# Patient Record
Sex: Male | Born: 1970 | Race: White | Hispanic: No | Marital: Married | Smoking: Never smoker
Health system: Southern US, Community
[De-identification: ages and names within clinical notes are randomized; demographics above are authoritative.]

## PROBLEM LIST (undated history)

## (undated) DIAGNOSIS — K529 Noninfective gastroenteritis and colitis, unspecified: Secondary | ICD-10-CM

## (undated) HISTORY — PX: KNEE SURGERY: SHX244

---

## 2019-07-16 ENCOUNTER — Encounter (HOSPITAL_COMMUNITY): Payer: Self-pay | Admitting: *Deleted

## 2019-07-16 ENCOUNTER — Emergency Department (HOSPITAL_COMMUNITY)
Admission: EM | Admit: 2019-07-16 | Discharge: 2019-07-16 | Disposition: A | Discharge status: Home / Self Care | Payer: No Typology Code available for payment source | Attending: Emergency Medicine | Admitting: Emergency Medicine

## 2019-07-16 ENCOUNTER — Emergency Department (HOSPITAL_COMMUNITY): Payer: No Typology Code available for payment source

## 2019-07-16 ENCOUNTER — Other Ambulatory Visit: Payer: Self-pay

## 2019-07-16 DIAGNOSIS — R531 Weakness: Secondary | ICD-10-CM | POA: Diagnosis present

## 2019-07-16 DIAGNOSIS — R202 Paresthesia of skin: Secondary | ICD-10-CM

## 2019-07-16 HISTORY — DX: Noninfective gastroenteritis and colitis, unspecified: K52.9

## 2019-07-16 LAB — COMPREHENSIVE METABOLIC PANEL
ALT: 20 U/L (ref 0–44)
AST: 21 U/L (ref 15–41)
Albumin: 4.7 g/dL (ref 3.5–5.0)
Alkaline Phosphatase: 44 U/L (ref 38–126)
Anion gap: 9 (ref 5–15)
BUN: 13 mg/dL (ref 6–20)
CO2: 26 mmol/L (ref 22–32)
Calcium: 9.2 mg/dL (ref 8.9–10.3)
Chloride: 104 mmol/L (ref 98–111)
Creatinine, Ser: 1.06 mg/dL (ref 0.61–1.24)
GFR calc Af Amer: >60 mL/min (ref >60)
GFR calc non Af Amer: >60 mL/min (ref >60)
Glucose, Bld: 141 mg/dL — ABNORMAL HIGH (ref 70–99)
Potassium: 3.7 mmol/L (ref 3.5–5.1)
Sodium: 139 mmol/L (ref 135–145)
Total Bilirubin: 2.4 mg/dL — ABNORMAL HIGH (ref 0.3–1.2)
Total Protein: 7.6 g/dL (ref 6.5–8.1)

## 2019-07-16 LAB — CBC WITH DIFFERENTIAL/PLATELET
Abs Immature Granulocytes: 0.04 10*3/uL (ref 0.00–0.07)
Basophils Absolute: 0.1 10*3/uL (ref 0.0–0.1)
Basophils Relative: 1 %
Eosinophils Absolute: 0.3 10*3/uL (ref 0.0–0.5)
Eosinophils Relative: 4 %
HCT: 45.2 % (ref 39.0–52.0)
Hemoglobin: 15.4 g/dL (ref 13.0–17.0)
Immature Granulocytes: 1 %
Lymphocytes Relative: 29 %
Lymphs Abs: 2.4 10*3/uL (ref 0.7–4.0)
MCH: 32.3 pg (ref 26.0–34.0)
MCHC: 34.1 g/dL (ref 30.0–36.0)
MCV: 94.8 fL (ref 80.0–100.0)
Monocytes Absolute: 0.9 10*3/uL (ref 0.1–1.0)
Monocytes Relative: 11 %
Neutro Abs: 4.4 10*3/uL (ref 1.7–7.7)
Neutrophils Relative %: 54 %
Platelets: 246 10*3/uL (ref 150–400)
RBC: 4.77 MIL/uL (ref 4.22–5.81)
RDW: 11.9 % (ref 11.5–15.5)
WBC: 8.3 10*3/uL (ref 4.0–10.5)
nRBC: 0 % (ref 0.0–0.2)

## 2019-07-16 MED ORDER — LORAZEPAM 2 MG/ML IJ SOLN
0.5000 mg | Freq: Once | INTRAMUSCULAR | Status: AC
  Administered 2019-07-16: 10:00:00 0.5 mg via INTRAVENOUS
  Filled 2019-07-16: qty 1

## 2019-07-16 NOTE — ED Provider Notes (Signed)
[facility] DEPT Provider Note   CSN: [id number] Arrival date & time: 07/16/19  3762     History Chief Complaint  Patient presents with  . Numbness    face  . Nausea    Billy Butler is a 49 y.o. male.  Patient was driving and then started having left-sided facial.  He has been treated for a sinus infection recently  The history is provided by the patient. No language interpreter was used.  Weakness Severity:  Mild Onset quality:  Sudden Timing:  Intermittent Progression:  Waxing and waning Chronicity:  New Context: not alcohol use   Relieved by:  Nothing Worsened by:  Nothing Associated symptoms: no abdominal pain, no chest pain, no cough, no diarrhea, no frequency, no headaches and no seizures        Past Medical History:  Diagnosis Date  . Colitis     There are no problems to display for this patient.   Past Surgical History:  Procedure Laterality Date  . KNEE SURGERY         No family history on file.  Social History   Tobacco Use  . Smoking status: Never Smoker  . Smokeless tobacco: Never Used  Substance Use Topics  . Alcohol use: Never  . Drug use: Never    Home Medications Prior to Admission medications   Medication Sig Start Date End Date Taking? Authorizing Provider  acetaminophen (TYLENOL) 325 MG tablet Take 650 mg by mouth every 6 (six) hours as needed for headache.   Yes [provider]  Multiple Vitamins-Minerals (MULTIVITAMIN WITH MINERALS) tablet Take 1 tablet by mouth daily.   Yes [provider]    Allergies    Patient has no known allergies.  Review of Systems   Review of Systems  Constitutional: Negative for appetite change and fatigue.  HENT: Negative for congestion, ear discharge and sinus pressure.        Facial numbness  Eyes: Negative for discharge.  Respiratory: Negative for cough.   Cardiovascular: Negative for chest pain.  Gastrointestinal: Negative for abdominal  pain and diarrhea.  Genitourinary: Negative for frequency and hematuria.  Musculoskeletal: Negative for back pain.  Skin: Negative for rash.  Neurological: Positive for weakness. Negative for seizures and headaches.  Psychiatric/Behavioral: Negative for hallucinations.    Physical Exam Updated Vital Signs BP 130/82   Pulse 89   Temp 97.8 F (36.6 C) (Oral)   Resp 18   Ht 6\' 2"  (1.88 m)   Wt 90.7 kg   SpO2 96%   BMI 25.68 kg/m   Physical Exam Vitals and nursing note reviewed.  Constitutional:      Appearance: He is well-developed.  HENT:     Head: Normocephalic.     Comments: Mild facial numbness    Nose: Nose normal.  Eyes:     General: No scleral icterus.    Conjunctiva/sclera: Conjunctivae normal.  Neck:     Thyroid: No thyromegaly.  Cardiovascular:     Rate and Rhythm: Normal rate and regular rhythm.     Heart sounds: No murmur. No friction rub. No gallop.   Pulmonary:     Breath sounds: No stridor. No wheezing or rales.  Chest:     Chest wall: No tenderness.  Abdominal:     General: There is no distension.     Tenderness: There is no abdominal tenderness. There is no rebound.  Musculoskeletal:        General: Normal range of motion.  Cervical back: Neck supple.  Lymphadenopathy:     Cervical: No cervical adenopathy.  Skin:    Findings: No erythema or rash.  Neurological:     Mental Status: He is alert and oriented to person, place, and time.     Motor: No abnormal muscle tone.     Coordination: Coordination normal.  Psychiatric:        Behavior: Behavior normal.     ED Results / Procedures / Treatments   Labs (all labs ordered are listed, but only abnormal results are displayed) Labs Reviewed  COMPREHENSIVE METABOLIC PANEL - Abnormal; Notable for the following components:      Result Value   Glucose, Bld 141 (*)    Total Bilirubin 2.4 (*)    All other components within normal limits  CBC WITH DIFFERENTIAL/PLATELET    EKG EKG  Interpretation  Date/Time:  Friday July 16 2019 09:34:06 EST Ventricular Rate:  121 PR Interval:    QRS Duration: 94 QT Interval:  310 QTC Calculation: 440 R Axis:   61 Text Interpretation: Sinus tachycardia Probable left atrial enlargement Probable inferior infarct, 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 COMPARISON:  None. FINDINGS: Brain: There is no acute infarction or intracranial hemorrhage. There is no intracranial mass, mass effect, or edema. There is no hydrocephalus or extra-axial fluid collection. Ventricles and sulci are normal in size and configuration. A few small foci of T2 hyperintensity in the supratentorial white matter are nonspecific but may reflect minor chronic microvascular ischemic changes. Vascular: Major vessel flow voids at the skull base are preserved. Skull and upper cervical spine: Normal marrow signal is preserved. Sinuses/Orbits: Mild mucosal thickening.  Left lens replacement. Other: Sella is unremarkable.  Mastoid air cells are clear. IMPRESSION: No acute infarction, hemorrhage, or mass. Minor chronic microvascular ischemic changes. Electronically Signed   By: Guadlupe Spanish M.D.   On: 07/16/2019 11:49    Procedures Procedures (including critical care time)  Medications Ordered in ED Medications  LORazepam (ATIVAN) injection 0.5 mg (0.5 mg Intravenous Given 07/16/19 1015)    ED Course  I have reviewed the triage vital signs and the nursing notes.  Pertinent labs & imaging results that were available during my care of the patient were reviewed by me and considered in my medical decision making (see chart for details).    MDM  Rules/Calculators/A&P                      Mri neg,   Pt with facial numbness.  Will follow up with pcp Final Clinical Impression(s) / ED Diagnoses Final diagnoses:  None    Rx / DC Orders ED Discharge Orders    None       Bethann Berkshire, MD 07/16/19 1224

## 2019-07-16 NOTE — ED Triage Notes (Signed)
Pt states 30 min ago sudden onset of left facial numbness also with left arm, no noted deficits in neuro assessment except alerted facial sensation. Nausea accompanied symptoms

## 2019-07-16 NOTE — Discharge Instructions (Addendum)
Follow-up with your family doctor in the next week or 2 for recheck of your sinuses and your numbness to your face

## 2019-07-16 NOTE — ED Notes (Signed)
Patient transported to MRI 

## 2019-07-16 NOTE — ED Notes (Signed)
Patient remains in MRI 

## 2021-04-09 IMAGING — MR MR HEAD W/O CM
10 series · 48 of 48 positions shown · non-contrast
Comparison: None.

CLINICAL DATA: Left facial and arm numbness

EXAM:
MRI HEAD WITHOUT CONTRAST
TECHNIQUE: Multiplanar, multiecho pulse sequences of the brain and surrounding
structures were obtained without intravenous contrast.

[Series 5: T1 · sagittal · 5.0mm · 0.75mm/px · 2 of 24 slices shown (1 of 2)]
[im 1/24]
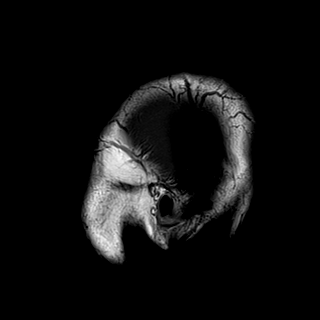
[im 24/24]
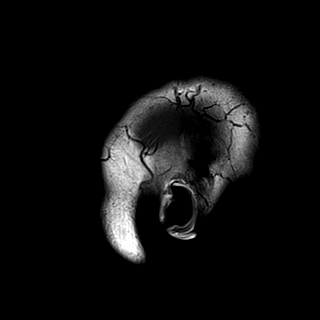

[Series 6: T2 · axial · 5.0mm · 0.62mm/px · z∈[-53,+105]mm · 2 of 26 slices shown (1 of 2)]
[im 1/26]
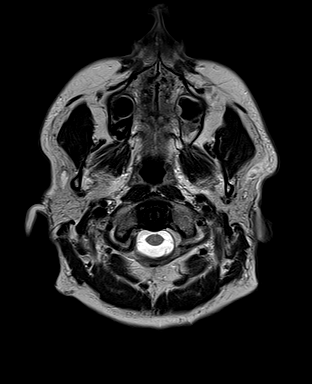
[im 26/26]
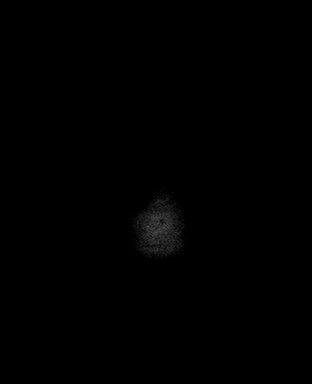

[Series 7: DWI · axial · 3.0mm · 1.36mm/px · z∈[-49,+100]mm · 8 of 104 slices shown (1 of 3)]
[im 1/104]
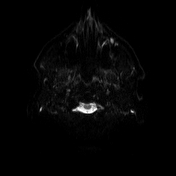
[im 15/104]
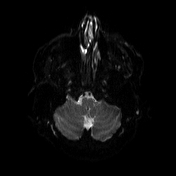
[im 30/104]
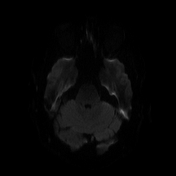
[im 45/104]
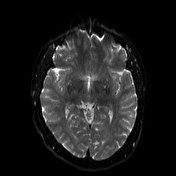
[im 59/104]
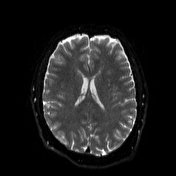
[im 74/104]
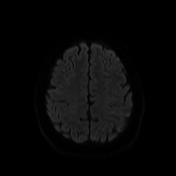
[im 89/104]
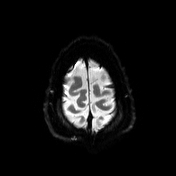
[im 104/104]
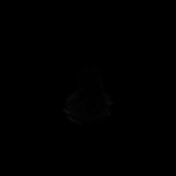

[Series 9: mip_images(sw) · axial · 24.0mm · 0.75mm/px · z∈[-46,+94]mm · 4 of 49 slices shown]
[im 1/49]
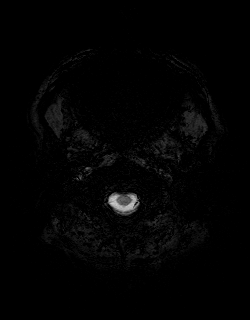
[im 17/49]
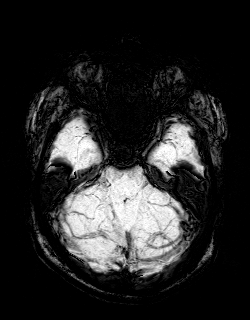
[im 33/49]
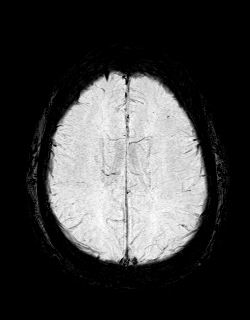
[im 49/49]
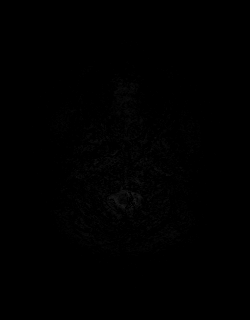

[Series 10: swi_images · axial · 3.0mm · 0.75mm/px · z∈[-56,+105]mm · 4 of 56 slices shown]
[im 1/56]
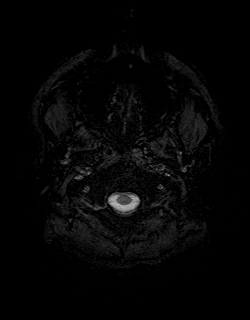
[im 19/56]
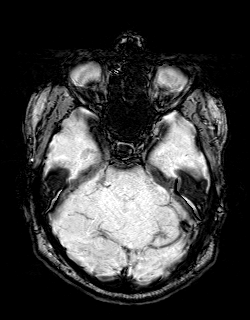
[im 37/56]
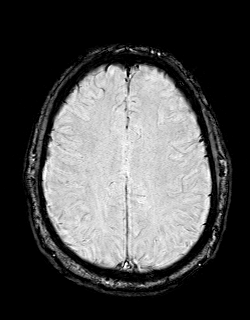
[im 56/56]
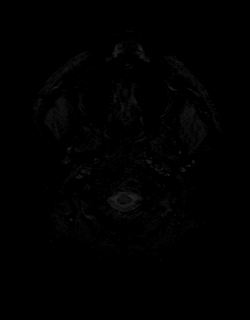

[Series 11: FLAIR · axial · 3.0mm · 0.75mm/px · z∈[-53,+102]mm · 4 of 54 slices shown]
[im 1/54]
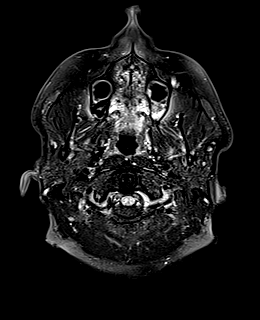
[im 18/54]
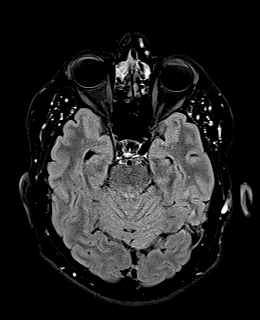
[im 36/54]
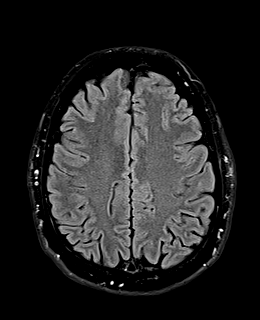
[im 54/54]
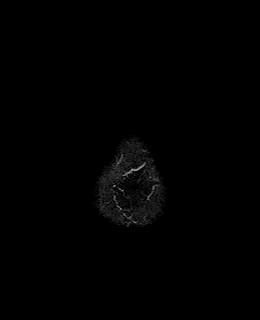

[Series 12: T1 · axial · 1.0mm · 0.94mm/px · z∈[-51,+103]mm · 12 of 160 slices shown (2 of 2)]
[im 1/160]
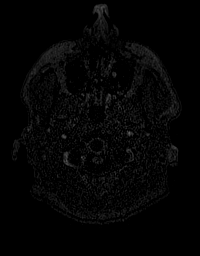
[im 15/160]
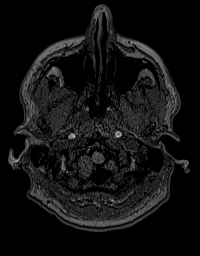
[im 29/160]
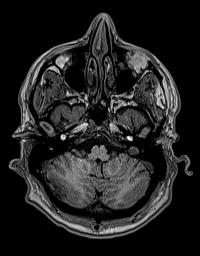
[im 44/160]
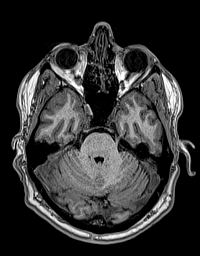
[im 58/160]
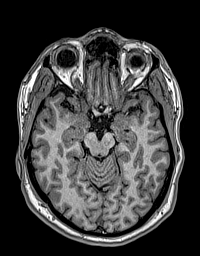
[im 73/160]
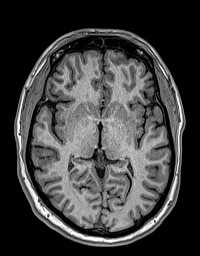
[im 87/160]
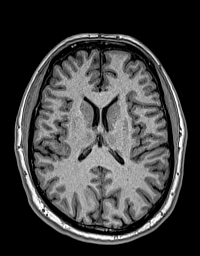
[im 102/160]
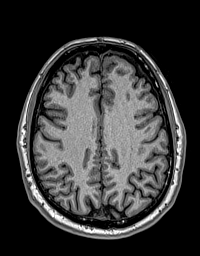
[im 116/160]
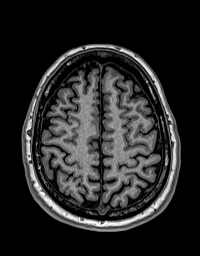
[im 131/160]
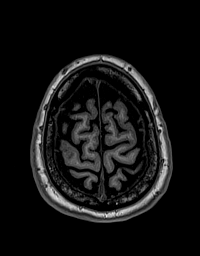
[im 145/160]
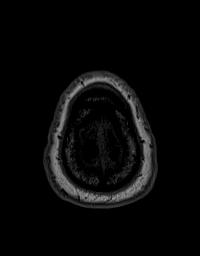
[im 160/160]
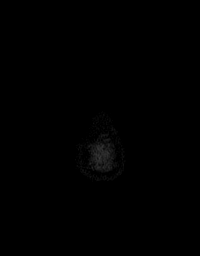

[Series 13: DWI · coronal · 5.0mm · 1.31mm/px · 6 of 72 slices shown (2 of 3)]
[im 1/72]
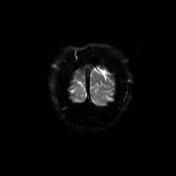
[im 15/72]
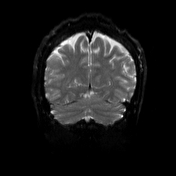
[im 29/72]
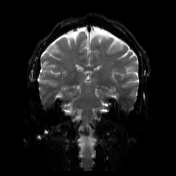
[im 43/72]
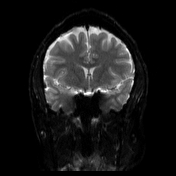
[im 57/72]
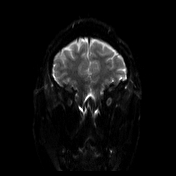
[im 72/72]
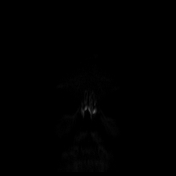

[Series 14: DWI · coronal · 5.0mm · 1.31mm/px · 3 of 36 slices shown (3 of 3)]
[im 1/36]
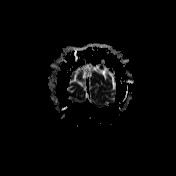
[im 18/36]
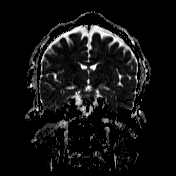
[im 36/36]
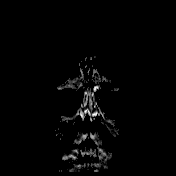

[Series 15: T2 · coronal · 5.0mm · 0.57mm/px · 3 of 36 slices shown (2 of 2)]
[im 1/36]
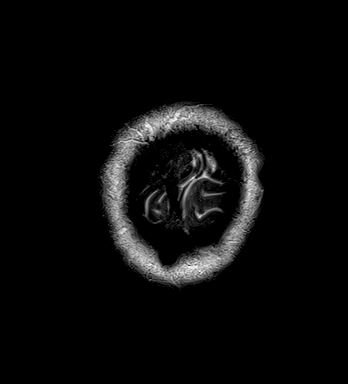
[im 18/36]
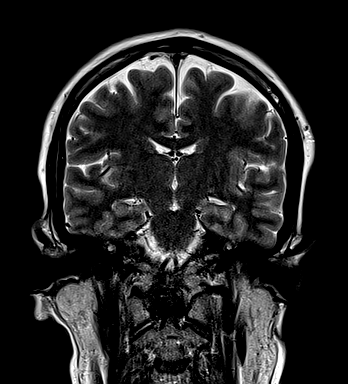
[im 36/36]
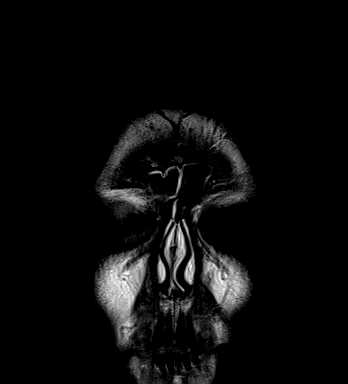

[48 of 48 positions shown; findings below may reference images not displayed]

FINDINGS: Brain: There is no acute infarction or intracranial hemorrhage.
There is no intracranial mass, mass effect, or edema. There is no
hydrocephalus or extra-axial fluid collection. Ventricles and sulci
are normal in size and configuration. A few small foci of T2
hyperintensity in the supratentorial white matter are nonspecific
but may reflect minor chronic microvascular ischemic changes.

Vascular: Major vessel flow voids at the skull base are preserved.

Skull and upper cervical spine: Normal marrow signal is preserved.

Sinuses/Orbits: Mild mucosal thickening.  Left lens replacement.

Other: Sella is unremarkable.  Mastoid air cells are clear.
IMPRESSION: No acute infarction, hemorrhage, or mass. Minor chronic
microvascular ischemic changes.
